# Patient Record
Sex: Male | Born: 1975 | Race: Black or African American | Hispanic: Yes | Marital: Married | Smoking: Current every day smoker
Health system: Southern US, Community
[De-identification: ages and names within clinical notes are randomized; demographics above are authoritative.]

---

## 2003-04-04 ENCOUNTER — Emergency Department (HOSPITAL_COMMUNITY): Admission: EM | Admit: 2003-04-04 | Discharge: 2003-04-05 | Payer: Self-pay | Admitting: Emergency Medicine

## 2004-03-05 ENCOUNTER — Emergency Department (HOSPITAL_COMMUNITY): Admission: EM | Admit: 2004-03-05 | Discharge: 2004-03-06 | Payer: Self-pay | Admitting: Emergency Medicine

## 2005-01-20 ENCOUNTER — Emergency Department (HOSPITAL_COMMUNITY): Admission: EM | Admit: 2005-01-20 | Discharge: 2005-01-20 | Payer: Self-pay | Admitting: Emergency Medicine

## 2012-12-09 ENCOUNTER — Emergency Department (HOSPITAL_COMMUNITY)
Admission: EM | Admit: 2012-12-09 | Discharge: 2012-12-09 | Disposition: A | Discharge status: Home / Self Care | Payer: Self-pay | Attending: Emergency Medicine | Admitting: Emergency Medicine

## 2012-12-09 ENCOUNTER — Emergency Department (HOSPITAL_COMMUNITY): Payer: Self-pay

## 2012-12-09 ENCOUNTER — Encounter (HOSPITAL_COMMUNITY): Payer: Self-pay | Admitting: Emergency Medicine

## 2012-12-09 DIAGNOSIS — M25579 Pain in unspecified ankle and joints of unspecified foot: Secondary | ICD-10-CM | POA: Insufficient documentation

## 2012-12-09 DIAGNOSIS — F17200 Nicotine dependence, unspecified, uncomplicated: Secondary | ICD-10-CM | POA: Insufficient documentation

## 2012-12-09 DIAGNOSIS — M25571 Pain in right ankle and joints of right foot: Secondary | ICD-10-CM

## 2012-12-09 MED ORDER — IBUPROFEN 800 MG PO TABS: 800.0000 mg | ORAL_TABLET | Freq: Three times a day (TID) | ORAL | Status: AC

## 2012-12-09 NOTE — ED Provider Notes (Signed)
CSN: [id number]     Arrival date & time 12/09/12  1458 History  This chart was scribed for non-physician practitioner Johnnette Gourd, PA-C, working with Hurman Horn, MD by Dorothey Baseman, ED Scribe. This patient was seen in room TR05C/TR05C and the patient's care was started at 5:23 PM.    Chief Complaint  Patient presents with  . Ankle Pain   The history is provided by the patient and a relative (daughter). The history is limited by a language barrier. A language interpreter was used.   HPI Comments: Jeremy Carroll is a 37 y.o. male who presents to the Emergency Department complaining of an intermittent pain to the right ankle onset 1 year ago that only occurs with walking or movement, 9/10 at its worst, but has been progressively worsening for the past few weeks. Patient denies any potential injury or trauma to the area. He states that he has not been seen before for these complaints. He reports taking Tylenol at home without relief. He denies any swelling to the area or back pain. The patient's daughter was used to gather patient history due to a language barrier. Patient denies any other pertinent medical history.  History reviewed. No pertinent past medical history. History reviewed. No pertinent past surgical history. No family history on file. History  Substance Use Topics  . Smoking status: Current Every Day Smoker  . Smokeless tobacco: Not on file  . Alcohol Use: Yes    Review of Systems  A complete 10 system review of systems was obtained and all systems are negative except as noted in the HPI and PMH.   Allergies  Review of patient's allergies indicates no known allergies.  Home Medications  No current outpatient prescriptions on file.  Triage Vitals: BP 148/73  Pulse 75  Temp(Src) 98.2 F (36.8 C) (Oral)  Resp 20  SpO2 97%  Physical Exam  Nursing note and vitals reviewed. Constitutional: He is oriented to person, place, and time. He appears well-developed and  well-nourished. No distress.  HENT:  Head: Normocephalic and atraumatic.  Eyes: Conjunctivae and EOM are normal.  Neck: Normal range of motion. Neck supple.  Cardiovascular: Normal rate, regular rhythm, normal heart sounds and intact distal pulses.   Pulmonary/Chest: Effort normal and breath sounds normal.  Musculoskeletal: Normal range of motion. He exhibits no edema.  TTP to anterior and lateral aspect of right ankle. Full range of motion. Pain with flexion and extension. No swelling or deformity.   Neurological: He is alert and oriented to person, place, and time.  Skin: Skin is warm and dry.  Psychiatric: He has a normal mood and affect. His behavior is normal.    ED Course  Procedures (including critical care time)  DIAGNOSTIC STUDIES: Oxygen Saturation is 97% on room air, normal by my interpretation.    COORDINATION OF CARE: 5:27 PM- Will order an x-ray of the right ankle. Discussed treatment plan with patient at bedside and patient verbalized agreement.   7:01 PM- Discussed that x-ray results were negative. Advised patient to apply ice to the area and to take ibuprofen at home to manage symptoms. Discussed treatment plan with patient at bedside and patient verbalized agreement.    Labs Review Labs Reviewed - No data to display  Imaging Review Dg Ankle Complete Right  12/09/2012   CLINICAL DATA:  Ankle pain.  No known injury.  EXAM: RIGHT ANKLE - COMPLETE 3+ VIEW  COMPARISON:  None.  FINDINGS: There is no evidence of fracture, dislocation, or  joint effusion. There is no evidence of arthropathy or other focal bone abnormality. Soft tissues are unremarkable.  IMPRESSION: Negative.   Electronically Signed   By: Charlett Nose M.D.   On: 12/09/2012 18:56    EKG Interpretation   None       MDM   1. Ankle pain, right     Return precautions given. Patient states understanding of treatment care plan and is agreeable.   I personally performed the services described in this  documentation, which was scribed in my presence. The recorded information has been reviewed and is accurate.     Trevor Mace, PA-C 12/09/12 1905

## 2012-12-09 NOTE — ED Notes (Signed)
Pt has gone to x-ray ?

## 2012-12-09 NOTE — ED Notes (Signed)
Call pt to go back to his room with no answer.

## 2012-12-09 NOTE — ED Notes (Signed)
The pt is c/o rt ankle pain for one year no known injury

## 2012-12-10 NOTE — ED Provider Notes (Signed)
Medical screening examination/treatment/procedure(s) were performed by non-physician practitioner and as supervising physician I was immediately available for consultation/collaboration.  Cearra Portnoy M Fremon Zacharia, MD 12/10/12 1241 

## 2013-06-25 ENCOUNTER — Emergency Department (HOSPITAL_COMMUNITY): Payer: 59

## 2013-06-25 ENCOUNTER — Emergency Department (HOSPITAL_COMMUNITY)
Admission: EM | Admit: 2013-06-25 | Discharge: 2013-06-25 | Disposition: A | Discharge status: Home / Self Care | Payer: 59 | Attending: Emergency Medicine | Admitting: Emergency Medicine

## 2013-06-25 ENCOUNTER — Encounter (HOSPITAL_COMMUNITY): Payer: Self-pay | Admitting: Emergency Medicine

## 2013-06-25 DIAGNOSIS — R209 Unspecified disturbances of skin sensation: Secondary | ICD-10-CM | POA: Insufficient documentation

## 2013-06-25 DIAGNOSIS — M25571 Pain in right ankle and joints of right foot: Secondary | ICD-10-CM

## 2013-06-25 DIAGNOSIS — Z7982 Long term (current) use of aspirin: Secondary | ICD-10-CM | POA: Insufficient documentation

## 2013-06-25 DIAGNOSIS — F17200 Nicotine dependence, unspecified, uncomplicated: Secondary | ICD-10-CM | POA: Insufficient documentation

## 2013-06-25 DIAGNOSIS — Z791 Long term (current) use of non-steroidal anti-inflammatories (NSAID): Secondary | ICD-10-CM | POA: Insufficient documentation

## 2013-06-25 DIAGNOSIS — M25579 Pain in unspecified ankle and joints of unspecified foot: Secondary | ICD-10-CM | POA: Insufficient documentation

## 2013-06-25 DIAGNOSIS — M25572 Pain in left ankle and joints of left foot: Secondary | ICD-10-CM

## 2013-06-25 LAB — CBG MONITORING, ED: GLUCOSE-CAPILLARY: 90 mg/dL (ref 70–99)

## 2013-06-25 MED ORDER — HYDROCODONE-ACETAMINOPHEN 5-325 MG PO TABS: 1.0000 | ORAL_TABLET | Freq: Four times a day (QID) | ORAL | Status: AC | PRN

## 2013-06-25 MED ORDER — NAPROXEN 250 MG PO TABS
500.0000 mg | ORAL_TABLET | Freq: Once | ORAL | Status: AC
  Administered 2013-06-25: 500 mg via ORAL
  Filled 2013-06-25: qty 2

## 2013-06-25 MED ORDER — NAPROXEN 500 MG PO TABS: 500.0000 mg | ORAL_TABLET | Freq: Two times a day (BID) | ORAL | Status: AC

## 2013-06-25 NOTE — ED Provider Notes (Signed)
CSN: [id number]     Arrival date & time 06/25/13  1159 History  This chart was scribed for non-physician practitioner, Francee PiccoloJennifer Conley Pawling, PA-C working with Derwood KaplanAnkit Nanavati, MD by Greggory StallionKayla Andersen, ED scribe. This patient was seen in room TR10C/TR10C and the patient's care was started at 2:30 PM.   Chief Complaint  Patient presents with  . Leg Pain   The history is provided by the patient. A language interpreter was used.   HPI Comments: Jeremy Carroll is a 38 y.o. male who presents to the Emergency Department complaining of gradual onset, burning bilateral foot and ankle pain that started about 2 months ago. Reports intermittent numbness in his right toes. Denies falls or injuries to his feet. Ambulation and certain movements worsen the pain. Denies any cuts or open wounds to his feet. He is also requesting his blood sugar be checked. Denies personal or family history of diabetes.   History reviewed. No pertinent past medical history. History reviewed. No pertinent past surgical history. History reviewed. No pertinent family history. History  Substance Use Topics  . Smoking status: Current Every Day Smoker -- 0.25 packs/day    Types: Cigarettes  . Smokeless tobacco: Not on file  . Alcohol Use: Yes    Review of Systems  Musculoskeletal: Positive for arthralgias.  Skin: Negative for wound.  Neurological: Positive for numbness.  All other systems reviewed and are negative.  Allergies  Review of patient's allergies indicates no known allergies.  Home Medications   Prior to Admission medications   Medication Sig Start Date End Date Taking? Authorizing Provider  aspirin-acetaminophen-caffeine (EXCEDRIN MIGRAINE) [phone] MG per tablet Take 2 tablets by mouth every 6 (six) hours as needed for headache.    Historical Provider, MD  HYDROcodone-acetaminophen (NORCO/VICODIN) 5-325 MG per tablet Take 1-2 tablets by mouth every 6 (six) hours as needed for severe pain. 06/25/13   Berdia Lachman L  Lashara Urey, PA-C  ibuprofen (ADVIL,MOTRIN) 800 MG tablet Take 1 tablet (800 mg total) by mouth 3 (three) times daily. 12/09/12   Trevor Maceobyn M Albert, PA-C  naproxen (NAPROSYN) 500 MG tablet Take 1 tablet (500 mg total) by mouth 2 (two) times daily with a meal. 06/25/13   Taisei Bonnette L Matie Dimaano, PA-C   BP 162/83  Pulse 60  Temp(Src) 98 F (36.7 C) (Oral)  Resp 18  SpO2 99%  Physical Exam  Nursing note and vitals reviewed. Constitutional: He is oriented to person, place, and time. He appears well-developed and well-nourished. No distress.  HENT:  Head: Normocephalic and atraumatic.  Right Ear: External ear normal.  Left Ear: External ear normal.  Nose: Nose normal.  Mouth/Throat: Oropharynx is clear and moist.  Eyes: Conjunctivae are normal.  Neck: Normal range of motion. Neck supple.  Cardiovascular: Normal rate and intact distal pulses.   Pulmonary/Chest: Effort normal.  Abdominal: Soft.  Musculoskeletal: Normal range of motion.       Right ankle: He exhibits normal range of motion.       Left ankle: He exhibits normal range of motion.       Right foot: He exhibits normal range of motion.       Left foot: He exhibits normal range of motion.  Sensation grossly intact.   Neurological: He is alert and oriented to person, place, and time.  Skin: Skin is warm and dry. He is not diaphoretic.  Psychiatric: He has a normal mood and affect.    ED Course  Procedures (including critical care time) Medications  naproxen (NAPROSYN) tablet 500  mg (500 mg Oral Given 06/25/13 1451)    DIAGNOSTIC STUDIES: Oxygen Saturation is 99% on RA, normal by my interpretation.    COORDINATION OF CARE: 2:41 PM-Discussed treatment plan which includes pain medication and xray with pt at bedside and pt agreed to plan.   Labs Review Labs Reviewed  CBG MONITORING, ED   Imaging Review Dg Ankle Complete Left  06/25/2013   CLINICAL DATA:  Anterior left ankle pain. Pain for 6 months. No known injury.  EXAM:  LEFT ANKLE COMPLETE - 3+ VIEW  COMPARISON:  None.  FINDINGS: There is no evidence of fracture or dislocation. There is irregularity and beaking along the dorsal aspect of the anterior talus that is very similar in appearance to the patient's contralateral (right) talus. There is also some irregularity along the dorsal surface of the navicular. Probable pes planus deformity noted.  IMPRESSION: Irregularity/beaking along the dorsal surface of the anterior talus and irregularity of the dorsal surface of the navicular bone. These findings are are similar to the contralateral ankle and appear chronic. Given the patient's pain, further evaluation with MRI could be considered.   Electronically Signed   By: Britta Mccreedy M.D.   On: 06/25/2013 15:50   Dg Ankle Complete Right  06/25/2013   CLINICAL DATA:  Anterior ankle pain.  EXAM: RIGHT ANKLE - COMPLETE 3+ VIEW  COMPARISON:  None.  FINDINGS: On the lateral image, there is irregularity along the dorsal talus, which is of unclear etiology, but it appears chronic.  No acute fracture. Ankle mortise is normally space and aligned with no arthropathic change.  Soft tissues are unremarkable.  IMPRESSION: Irregularity of the talus, along its dorsal aspect anterior to the ankle joint. This could be from remote trauma. It could reflect a combination of resort to abut and productive changes at the ankle joint capsular insertion.  No other abnormalities.   Electronically Signed   By: Amie Portland M.D.   On: 06/25/2013 15:40     EKG Interpretation None      MDM   Final diagnoses:  Bilateral ankle pain    Filed Vitals:   06/25/13 1226  BP: 162/83  Pulse: 60  Temp: 98 F (36.7 C)  Resp: 18   Afebrile, NAD, non-toxic appearing, AAOx4. Glucose is wnl. Neurovascularly intact. Normal sensation. Patient able to ambulate on feet w/o abnormality. X-rays reviewed. Given history, chronicity of symptoms, and physical exam feel patient can be followed up as outpatient. RICE  method discussed. Pain management indicated. Patient is agreeable to plan. Patient is stable at time of discharge Patient d/w with Dr. Rhunette Croft, agrees with plan.      I personally performed the services described in this documentation, which was scribed in my presence. The recorded information has been reviewed and is accurate.  Jeannetta Ellis, PA-C 06/25/13 1623

## 2013-06-25 NOTE — ED Notes (Signed)
Declined W/C at D/C and was escorted to lobby by RN. 

## 2013-06-25 NOTE — ED Notes (Signed)
Pt states that he has bilateral leg pain. Pt was seen for the rt leg last week adm now left leg is hurting as well. Denies swelling or injury. Pt states that it hurts worse when he walks. Pt states that he was also was told that he needed to "have his sugar check". Pt states that he has increased urination and thirst.

## 2013-06-25 NOTE — Discharge Instructions (Signed)
Please follow up with your primary care physician in 1-2 days. If you do not have one please call the [facility] number listed above. Please take pain medication and/or muscle relaxants as prescribed and as needed for pain. Please do not drive on narcotic pain medication or on muscle relaxants. Please take Naproxen for mild to moderate pain. Please follow up with Dr. Lorenda Peckalldorff to schedule a follow up appointment. Please read all discharge instructions and return precautions.   Dolor en el tobillo (Ankle Pain)  El dolor en el tobillo es un sntoma comn. Los TransMontaignehuesos, Dietitiancartlagos, tendones y Exelon Corporationmsculos de la articulacin del tobillo realizan una gran cantidad de Electronics engineertrabajo cada da. La articulacin del tobillo mantiene el peso del cuerpo y le permite girarlo. El dolor puede ocurrir en cualquiera de los lados o en la parte posterior de uno o ambos tobillos. Puede ser agudo y ardiente o sordo y Dobsonmolesto. Puede haber sensibilidad, rigidez, enrojecimiento o sensacin de calor. El dolor ocurre ms a menudo al camina o al hacer presin sobre el tobillo. CAUSAS  Hay muchas razones para el dolor de tobillo. Es importante trabajar con su mdico para identificar la causa ya que hay muchas enfermedades que pueden afectar huesos, Dietitiancartlagos, msculos y tendones. Las causas pueden ser:   Quincy SheehanLesiones, incluyendo una rotura (fractura), esguince o distensin a menudo debido a una cada, al deporte o a una actividad de alto impacto.  Hinchazn (inflamacin) de un tendn (tendinitis).  Ruptura del tendn de Aquiles.  Inestabilidad del tobillo despus de esguinces y distensiones repetidas.  Alineacin deficiente del pie.  Presin sobre un nervio (sndrome del tnel tarsal).  Artritis en el tobillo o en las Shingle Springsmembranas del Kingstobillo.  Formacin de Materials engineercristales en el tobillo (gota o pseudo gota). DIAGNSTICO  El diagnstico se basa en la historia clnica, los sntomas, los Buckleyresultados del examen fsico y de las  pruebas diagnsticas. Las pruebas de diagnstico incluyen radiografas o un estudio magntico computarizado (imgenes por resonancia Sunsetmagntica, [place]IMR).  TRATAMIENTO  El tratamiento depender de la causa y puede incluir:   Automotive engineervitar la presin en el tobillo y Film/video editorlimitar las Humacaoactividades.  El uso de Nadamuletas u otros dispositivos que lo ayuden a Advertising account plannercaminar (bastn o aparato ortopdico).  Reposo, hielo, compresin, elevacin.  Recibir fisioterapia o Architecthacer ejercicios en casa.  Uso de suplementos en el calzado o zapatos especiales.  Bajar de Raviniapeso.  Tomar medicamentos para reducir Chief Technology Officerel dolor o la hinchazn o aplicarse una inyeccin.  Someterse a [place] and Herz[place]ov[place]una ciruga. INSTRUCCIONES PARA EL CUIDADO EN EL HOGAR   Solo tome medicamentos de venta libre o recetados para Chief Technology Officerel dolor, Dentistmalestar o fiebre, segn las indicaciones del mdico.  Aplique hielo sobre la zona lesionada.  Ponga el hielo en una bolsa plstica.  Colquese una toalla entre la piel y la bolsa de hielo.  Deje el hielo en el lugar durante 15 a 20 minutos por vez, 3 a 4 veces por da.  Mantenga la pierna levantada (elevada) cuando sea posible para reducir la hinchazn.  Evite las actividades que causan el dolor en el tobillo.  Siga los ejercicios especficos segn las indicaciones de su mdico.  Anote la frecuencia con que tiene dolor en el tobillo, la ubicacin del dolor y Clydecomo lo siente. Esta informacin puede ser til para usted y su mdico.  Consulte a su mdico cundo podr regresar al Aleen Campitrabajo o a la prctica de deportes y si puede conducir.  Concurra a las visitas de control con el mdico  para realizar [employer], o recibir vacunas, segn las indicaciones. SOLICITE ATENCIN MDICA SI:   El dolor o la hinchazn continan o empeoran despus de 1 semana.  Tiene una temperatura oral mayor a 38,9 C (102 F).  No se siente bien o tiene escalofros.  Aumenta la dificultad para caminar.  Siente que pierde la sensibilidad o tiene  nuevos sntomas.  Tiene preguntas o preocupaciones. ASEGRESE DE QUE:   Comprende estas instrucciones.  Controlar su enfermedad.  Solicitar ayuda de inmediato si no mejora o si empeora. Document Released: 09/29/2011 [facility] Patient Information 2014 [place], [place].

## 2013-06-28 NOTE — ED Provider Notes (Signed)
Medical screening examination/treatment/procedure(s) were performed by non-physician practitioner and as supervising physician I was immediately available for consultation/collaboration.   EKG Interpretation None       Asahel Risden, MD 06/28/13 0144 

## 2015-06-08 IMAGING — CR DG ANKLE COMPLETE 3+V*R*
3 series · 3 of 3 positions shown · non-contrast
Comparison: None.

CLINICAL DATA: Ankle pain.  No known injury.

EXAM:
RIGHT ANKLE - COMPLETE 3+ VIEW

[x ankle ap right]
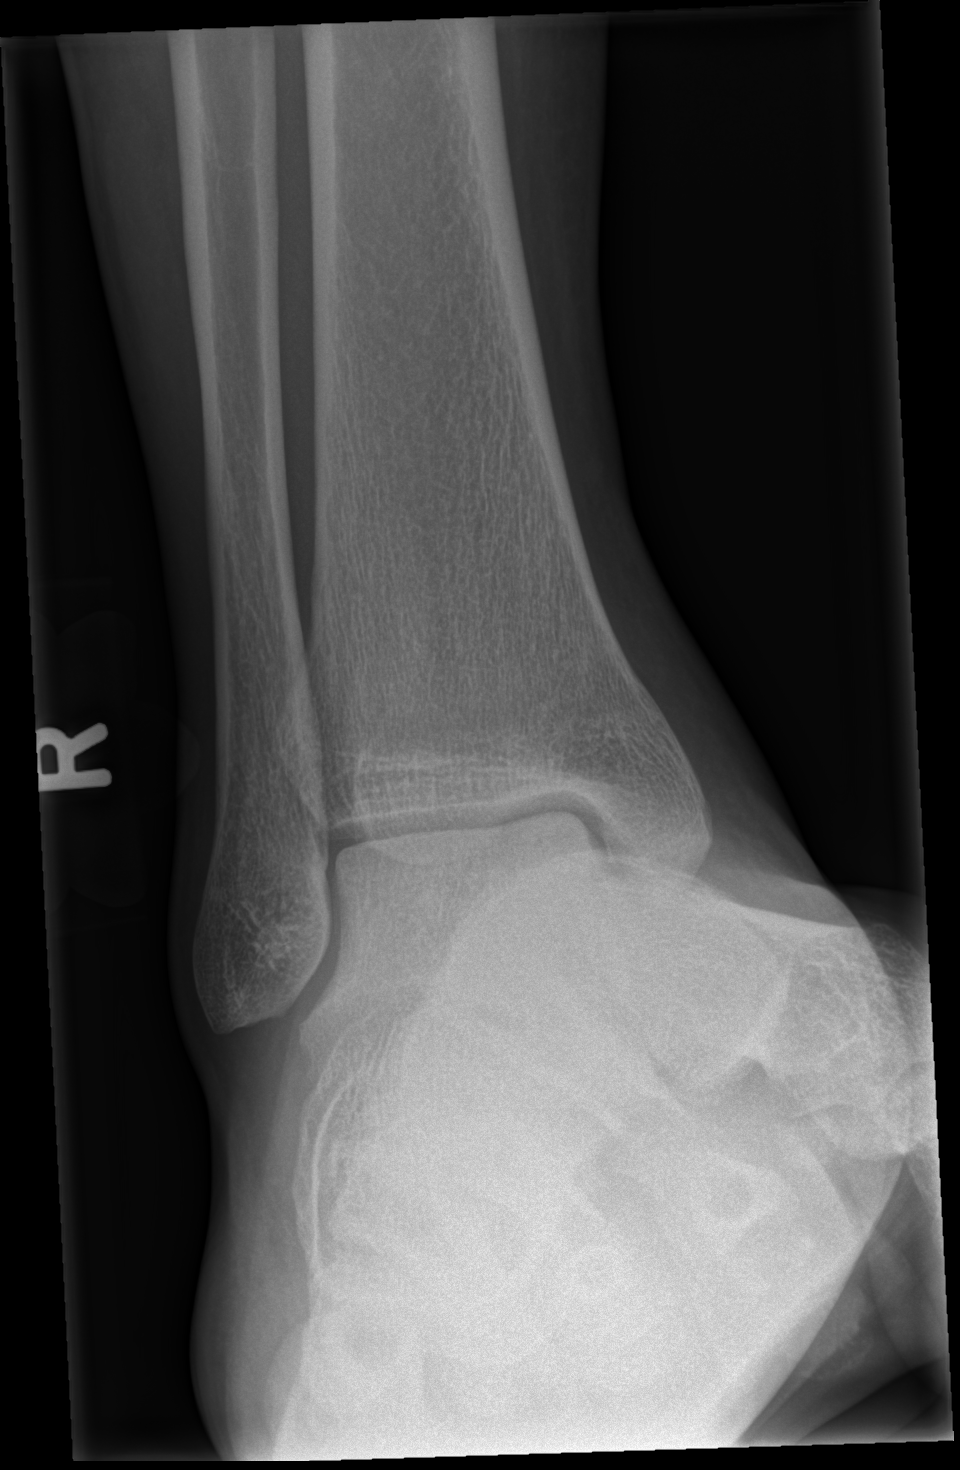

[x ankle obl right]
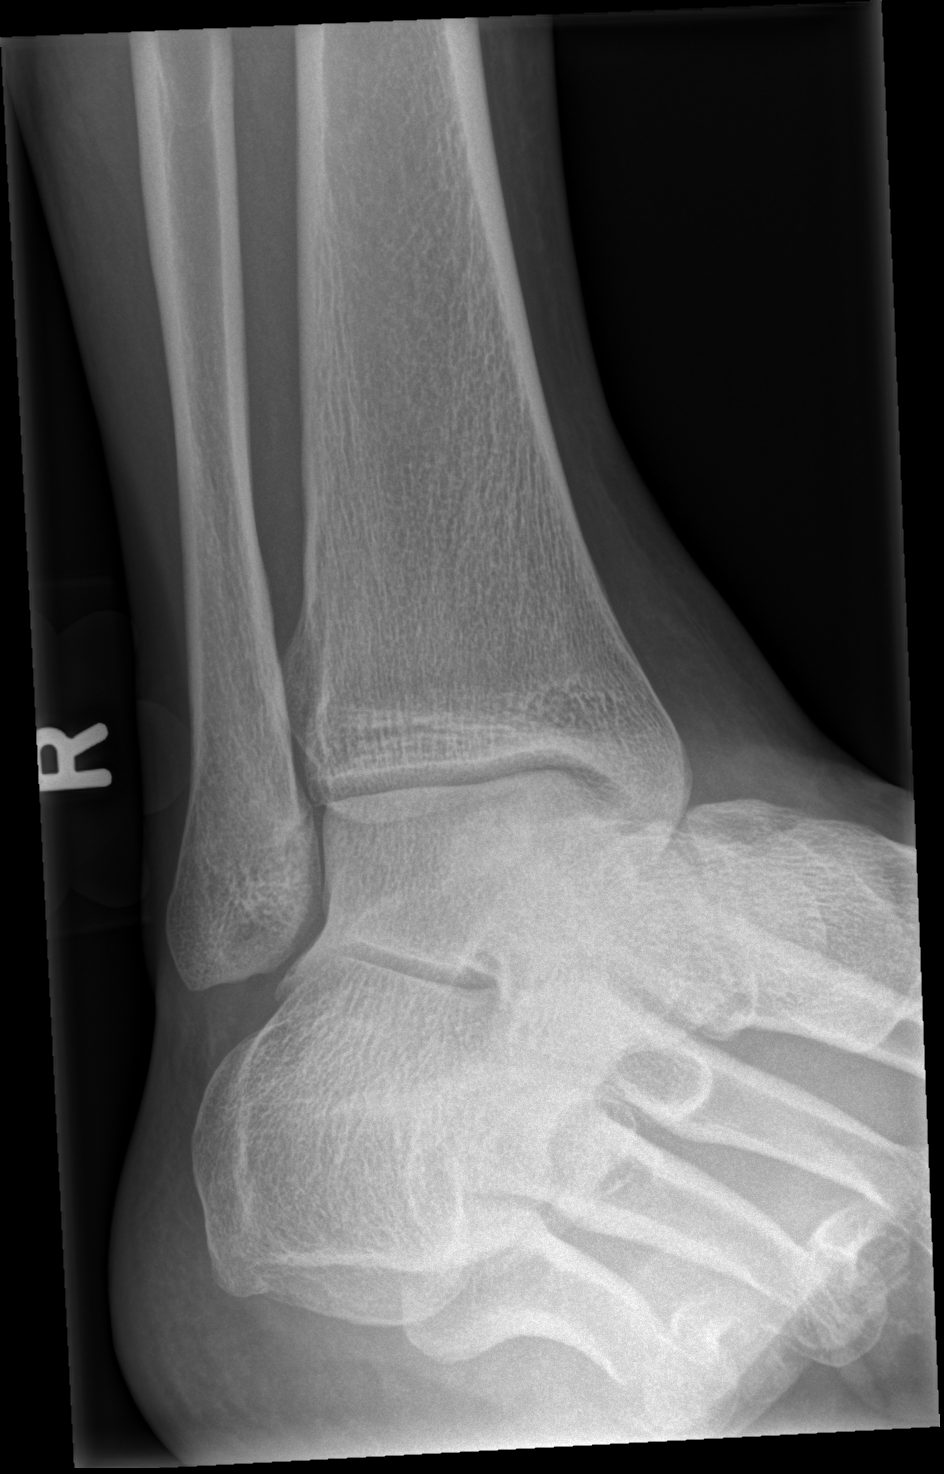

[x ankle lat right]
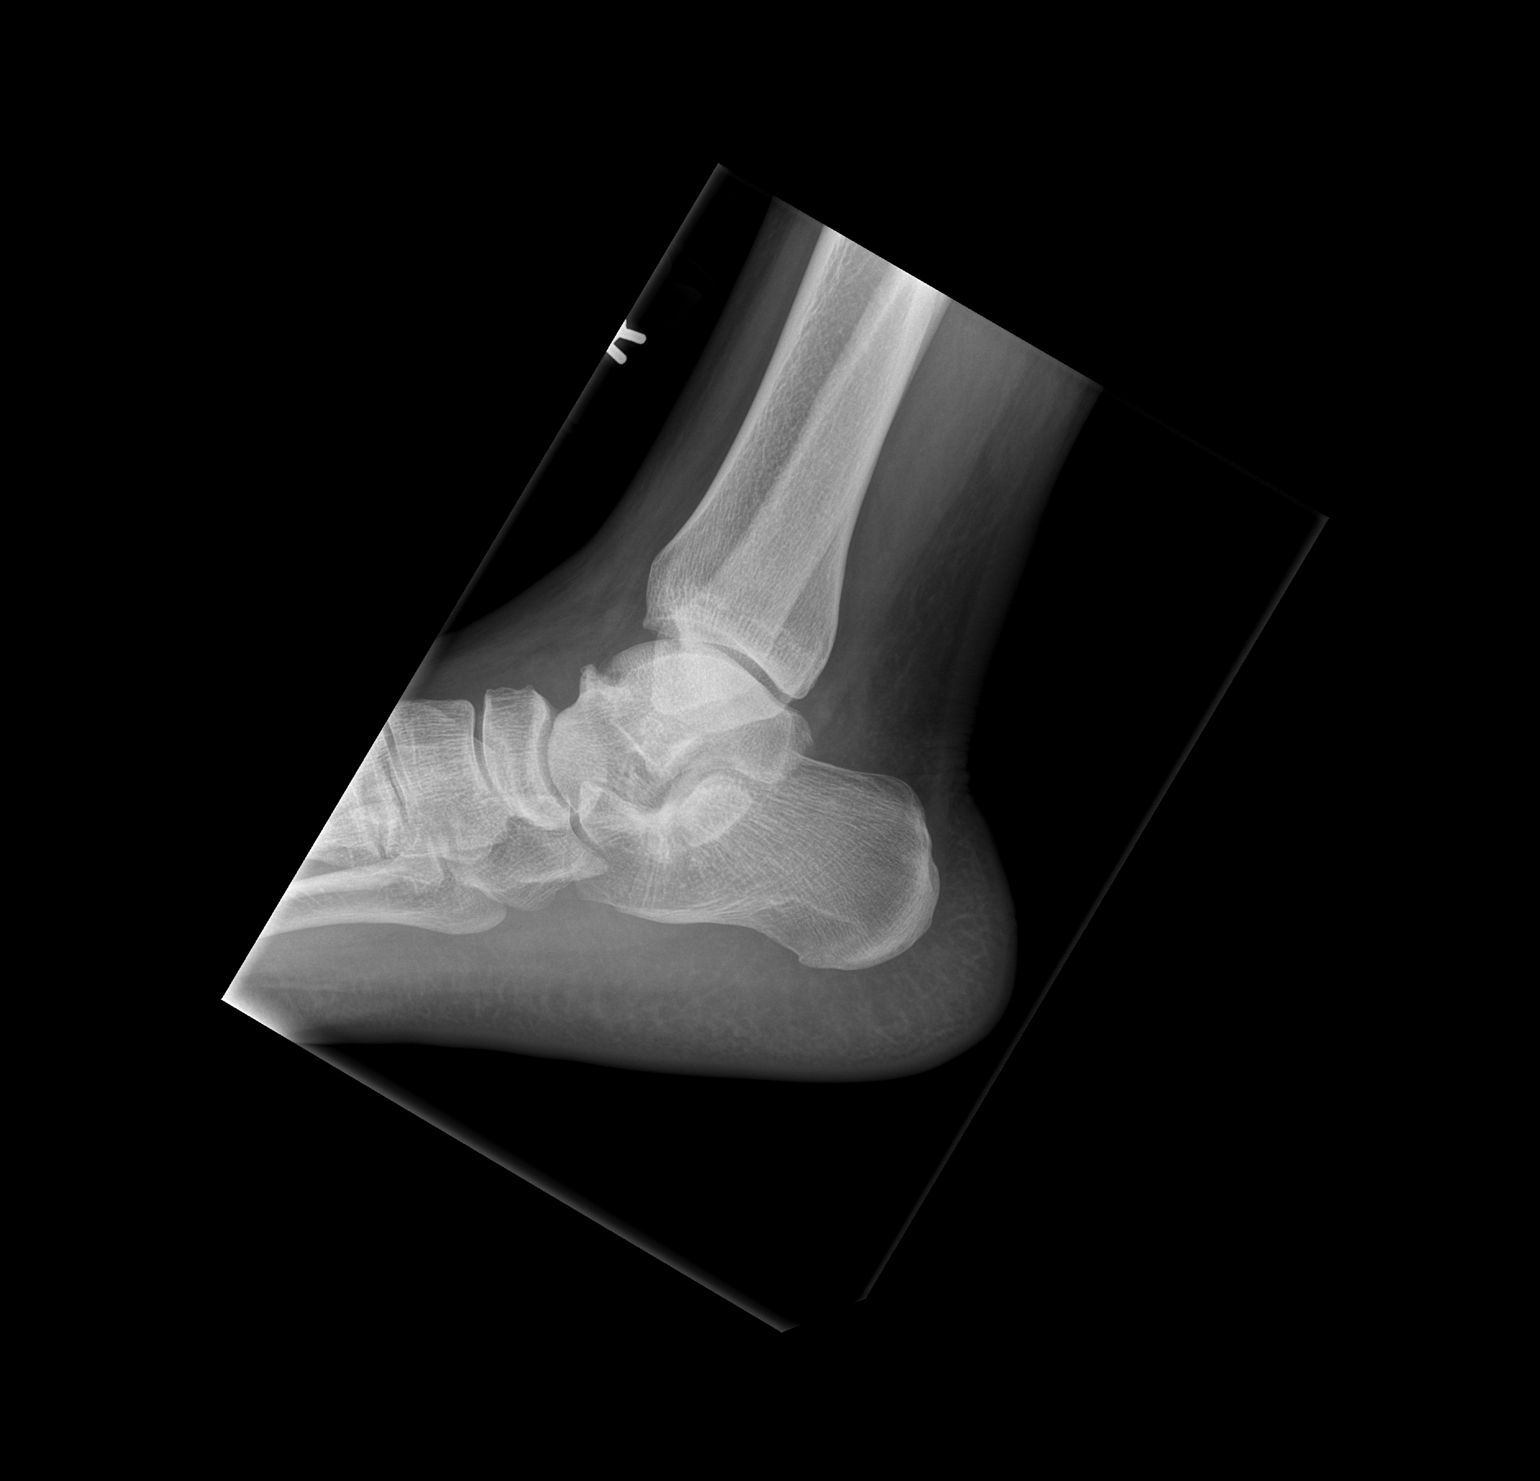

[3 of 3 positions shown; findings below may reference images not displayed]

FINDINGS: There is no evidence of fracture, dislocation, or joint effusion.
There is no evidence of arthropathy or other focal bone abnormality.
Soft tissues are unremarkable.
IMPRESSION: Negative.

## 2015-12-23 IMAGING — CR DG ANKLE COMPLETE 3+V*R*
3 series · 3 of 3 positions shown · non-contrast
Comparison: None.

CLINICAL DATA: Anterior ankle pain.

EXAM:
RIGHT ANKLE - COMPLETE 3+ VIEW

[t ankle joint ap right]
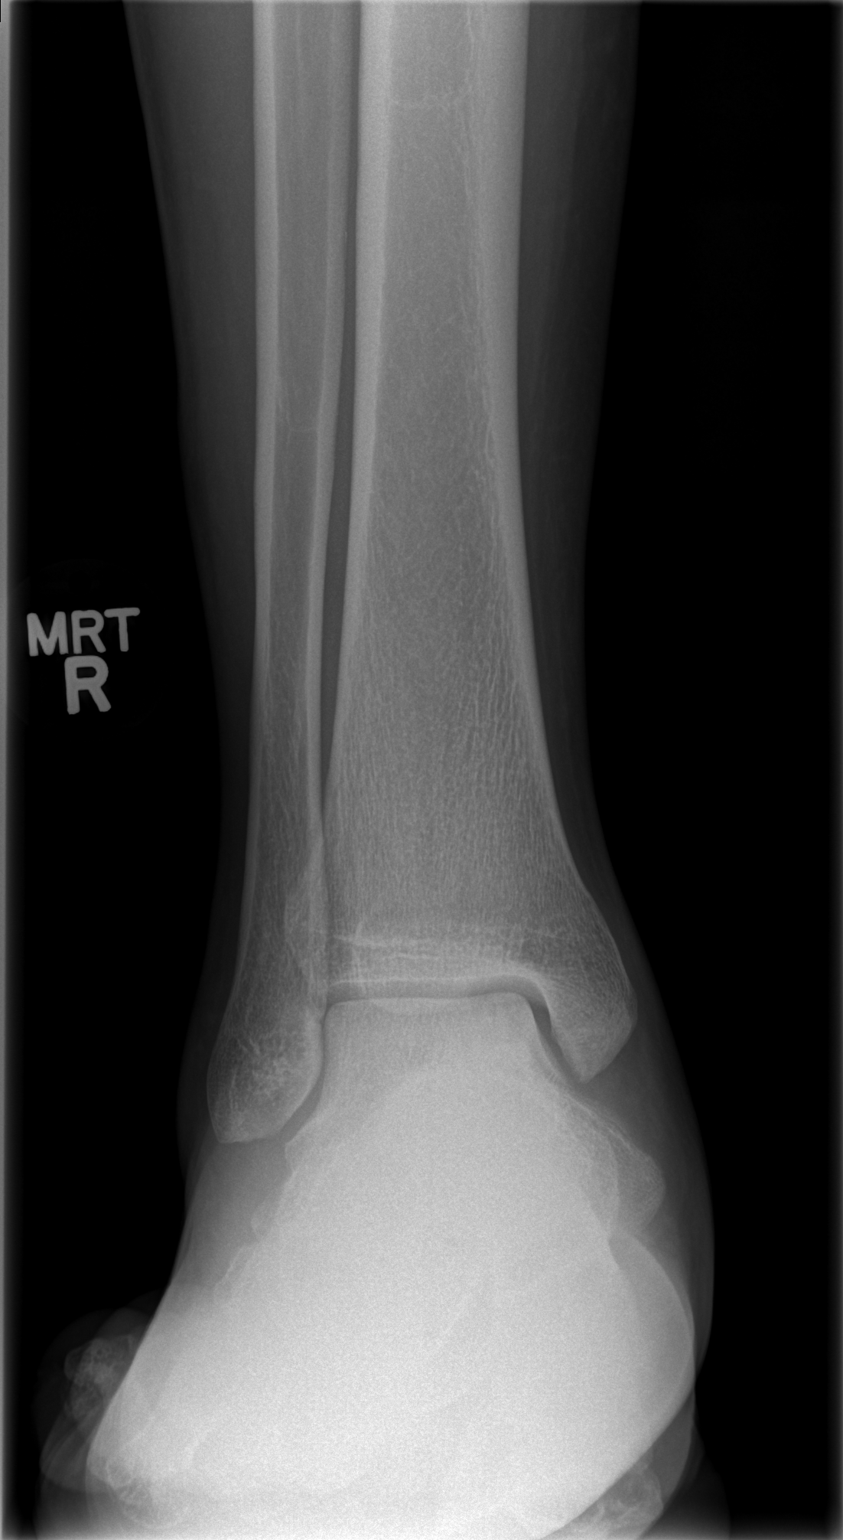

[t ankle joint oblique right]
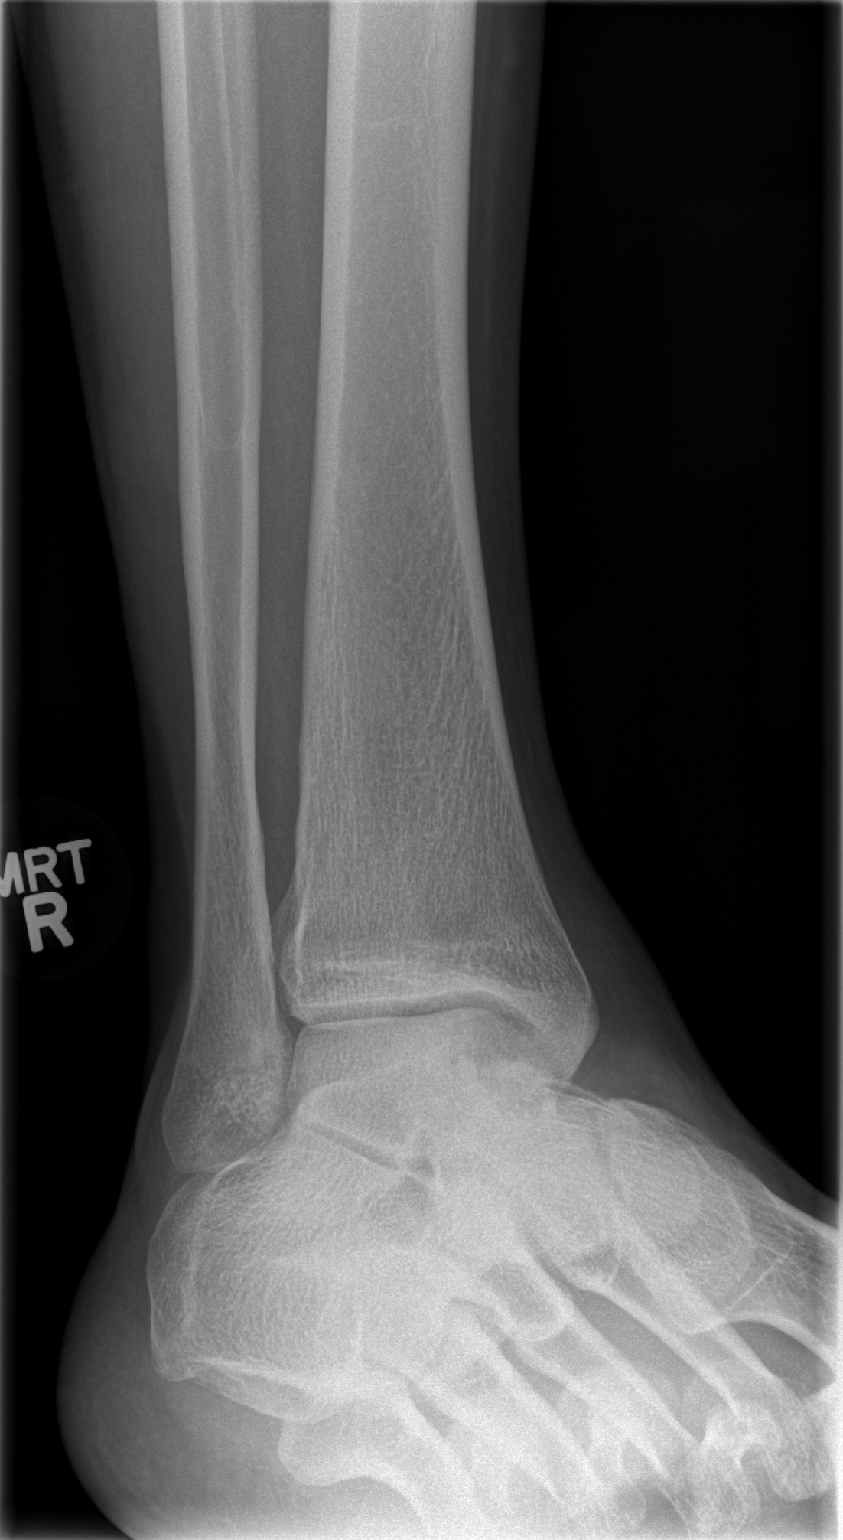

[t ankle joint lat right]
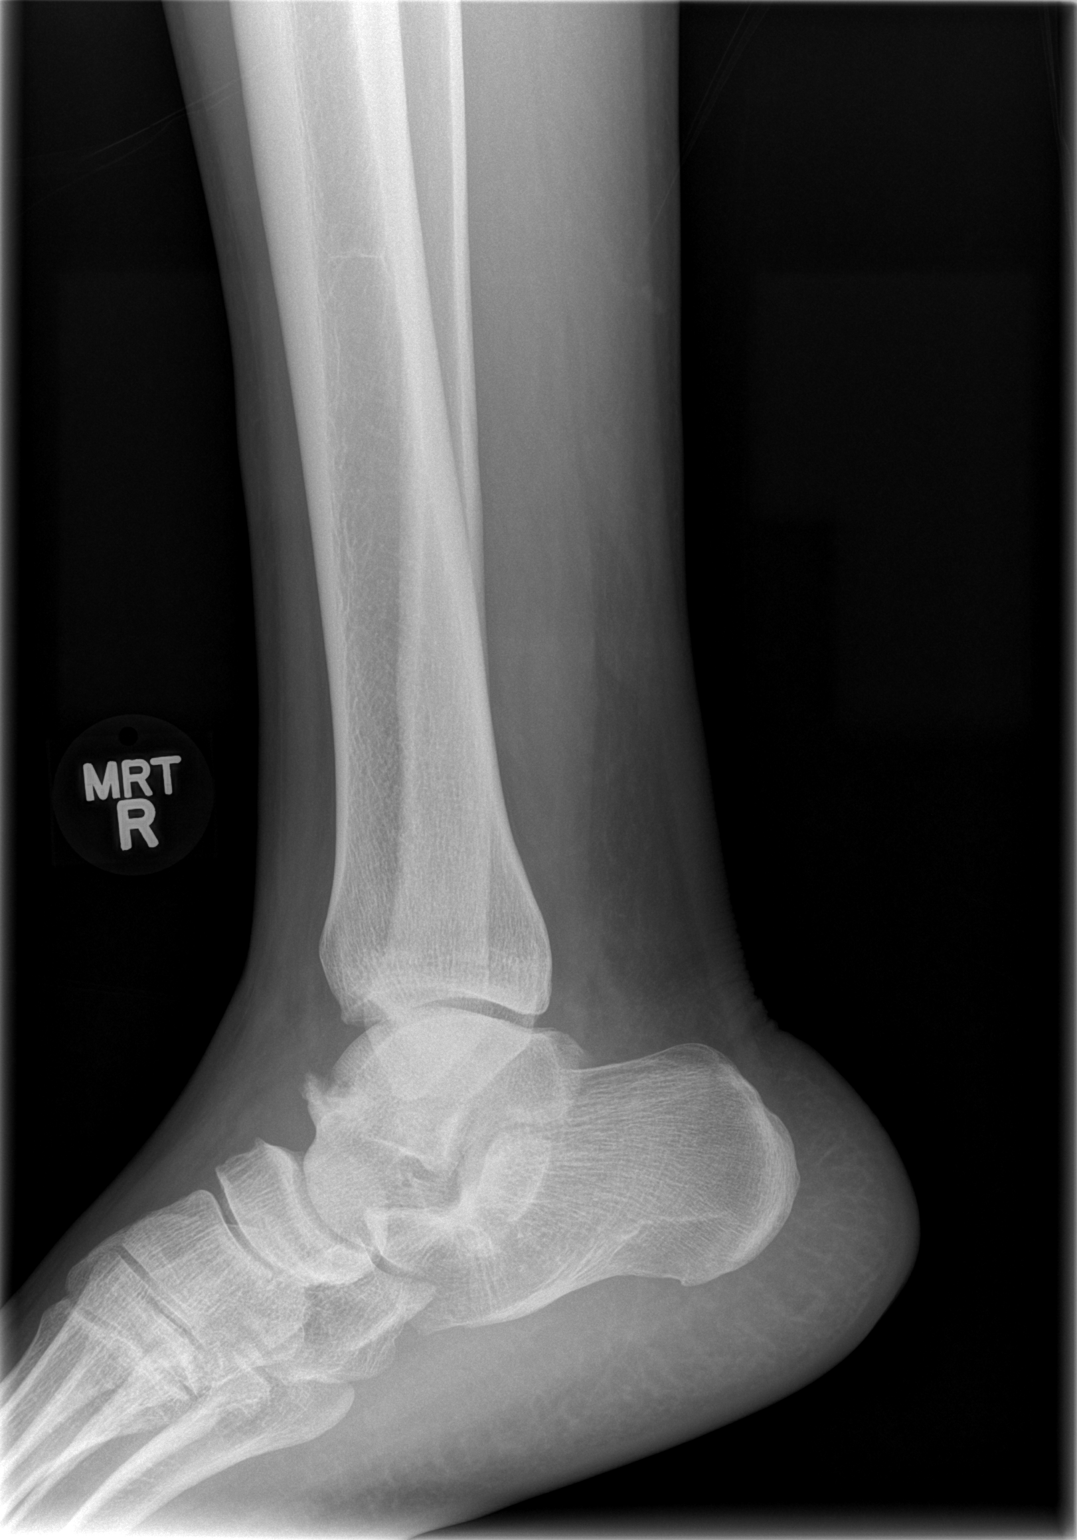

[3 of 3 positions shown; findings below may reference images not displayed]

FINDINGS: On the lateral image, there is irregularity along the dorsal talus,
which is of unclear etiology, but it appears chronic.

No acute fracture. Ankle mortise is normally space and aligned with
no arthropathic change.

Soft tissues are unremarkable.
IMPRESSION: Irregularity of the talus, along its dorsal aspect anterior to the
ankle joint. This could be from remote trauma. It could reflect a
combination of resort to abut and productive changes at the ankle
joint capsular insertion.

No other abnormalities.
